# Patient Record
Sex: Male | Born: 1990 | Race: Asian | Hispanic: No | Marital: Single | Smoking: Former smoker
Health system: Southern US, Community
[De-identification: ages and names within clinical notes are randomized; demographics above are authoritative.]

## PROBLEM LIST (undated history)

## (undated) DIAGNOSIS — Z789 Other specified health status: Secondary | ICD-10-CM

## (undated) HISTORY — PX: NO PAST SURGERIES: SHX2092

---
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
## 2008-02-03 ENCOUNTER — Emergency Department (HOSPITAL_COMMUNITY): Admission: EM | Admit: 2008-02-03 | Discharge: 2008-02-03 | Payer: Self-pay | Admitting: Emergency Medicine

## 2008-02-06 ENCOUNTER — Emergency Department (HOSPITAL_COMMUNITY): Admission: EM | Admit: 2008-02-06 | Discharge: 2008-02-06 | Payer: Self-pay | Admitting: Emergency Medicine

## 2009-09-01 ENCOUNTER — Emergency Department (HOSPITAL_COMMUNITY): Admission: EM | Admit: 2009-09-01 | Discharge: 2009-09-01 | Payer: Self-pay | Admitting: Family Medicine
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
## 2010-11-24 LAB — GLUCOSE, CAPILLARY: Glucose-Capillary: 128 mg/dL — ABNORMAL HIGH (ref 70–99)

## 2011-02-23 ENCOUNTER — Emergency Department (HOSPITAL_COMMUNITY)
Admission: EM | Admit: 2011-02-23 | Discharge: 2011-02-23 | Disposition: A | Discharge status: Home / Self Care | Payer: 59 | Source: Home / Self Care | Attending: Family Medicine | Admitting: Family Medicine

## 2011-02-23 DIAGNOSIS — J40 Bronchitis, not specified as acute or chronic: Secondary | ICD-10-CM

## 2011-02-23 MED ORDER — ALBUTEROL SULFATE HFA 108 (90 BASE) MCG/ACT IN AERS
2.0000 | INHALATION_SPRAY | RESPIRATORY_TRACT | Status: DC | PRN
  Administered 2011-02-23: 2 via RESPIRATORY_TRACT

## 2011-02-23 MED ORDER — GUAIFENESIN-CODEINE 100-10 MG/5ML PO SYRP: 5.0000 mL | ORAL_SOLUTION | Freq: Four times a day (QID) | ORAL | Status: AC | PRN

## 2011-02-23 MED ORDER — ALBUTEROL SULFATE HFA 108 (90 BASE) MCG/ACT IN AERS
INHALATION_SPRAY | RESPIRATORY_TRACT | Status: AC
  Filled 2011-02-23: qty 6.7

## 2011-02-23 MED ORDER — AZITHROMYCIN 250 MG PO TABS: 250.0000 mg | ORAL_TABLET | Freq: Every day | ORAL | Status: AC

## 2011-02-23 NOTE — ED Provider Notes (Signed)
History     CSN: [id number]  Arrival date & time 02/23/11  Philip Wong   First MD Initiated Contact with Patient 02/23/11 1951      Chief Complaint  Patient presents with  . Cough    (Consider location/radiation/quality/duration/timing/severity/associated sxs/prior treatment) HPI Comments: Philip Wong presents for evaluation of 2 weeks of non-productive cough. He reports headache with the cough. He denies any sick contacts. He denies any hx of asthma and he does not smoke.  Patient is a 21 y.o. male presenting with cough. The history is provided by the patient.  Cough This is a new problem. The current episode started more than 1 week ago. The problem occurs constantly. The problem has not changed since onset.The cough is non-productive. Associated symptoms include headaches and rhinorrhea. Pertinent negatives include no chest pain, no shortness of breath and no wheezing. He is not a smoker. His past medical history does not include asthma.    History reviewed. No pertinent past medical history.  History reviewed. No pertinent past surgical history.  History reviewed. No pertinent family history.  History  Substance Use Topics  . Smoking status: Never Smoker   . Smokeless tobacco: Not on file  . Alcohol Use: No      Review of Systems  Constitutional: Negative.  Negative for fever.  HENT: Positive for congestion, rhinorrhea and sinus pressure.   Eyes: Negative.   Respiratory: Positive for cough. Negative for shortness of breath and wheezing.   Cardiovascular: Negative.  Negative for chest pain.  Gastrointestinal: Negative.   Genitourinary: Negative.   Musculoskeletal: Negative.   Skin: Negative.   Neurological: Positive for headaches.    Allergies  Review of patient's allergies indicates no known allergies.  Home Medications   Current Outpatient Rx  Name Route Sig Dispense Refill  . ACETAMINOPHEN 325 MG PO TABS Oral Take 650 mg by mouth every 6 (six) hours as needed.          BP 127/63  Pulse 75  Temp(Src) 97.3 F (36.3 C) (Oral)  Resp 16  SpO2 97%  Physical Exam  Nursing note and vitals reviewed. Constitutional: He is oriented to person, place, and time. He appears well-developed and well-nourished.  HENT:  Head: Normocephalic and atraumatic.  Right Ear: Tympanic membrane normal.  Left Ear: Tympanic membrane normal.  Mouth/Throat: Uvula is midline, oropharynx is clear and moist and mucous membranes are normal.  Eyes: EOM are normal.  Neck: Normal range of motion.  Cardiovascular: Normal rate and regular rhythm.   Pulmonary/Chest: Effort normal. He has wheezes.  Musculoskeletal: Normal range of motion.  Neurological: He is alert and oriented to person, place, and time.  Skin: Skin is warm and dry.  Psychiatric: His behavior is normal.    ED Course  Procedures (including critical care time)  Labs Reviewed - No data to display No results found.   1. Bronchitis       MDM  rx given for Z-pak and guaifenesin AC; supportive care        Richardo Priest, MD 03/24/11 [phone]

## 2011-02-23 NOTE — ED Notes (Signed)
Patient c/o cough , stuffiness for 2 weeks ; c/o facial pressure, pain in top of head when he coughs

## 2011-11-11 ENCOUNTER — Encounter (HOSPITAL_COMMUNITY): Payer: Self-pay | Admitting: Emergency Medicine

## 2011-11-11 ENCOUNTER — Emergency Department (HOSPITAL_COMMUNITY)
Admission: EM | Admit: 2011-11-11 | Discharge: 2011-11-11 | Disposition: A | Discharge status: Home / Self Care | Payer: Self-pay | Attending: Emergency Medicine | Admitting: Emergency Medicine

## 2011-11-11 DIAGNOSIS — S0191XA Laceration without foreign body of unspecified part of head, initial encounter: Secondary | ICD-10-CM

## 2011-11-11 DIAGNOSIS — Z23 Encounter for immunization: Secondary | ICD-10-CM | POA: Insufficient documentation

## 2011-11-11 DIAGNOSIS — S0190XA Unspecified open wound of unspecified part of head, initial encounter: Secondary | ICD-10-CM | POA: Insufficient documentation

## 2011-11-11 DIAGNOSIS — W010XXA Fall on same level from slipping, tripping and stumbling without subsequent striking against object, initial encounter: Secondary | ICD-10-CM | POA: Insufficient documentation

## 2011-11-11 MED ORDER — TETANUS-DIPHTH-ACELL PERTUSSIS 5-2.5-18.5 LF-MCG/0.5 IM SUSP
0.5000 mL | Freq: Once | INTRAMUSCULAR | Status: AC
  Administered 2011-11-11: 0.5 mL via INTRAMUSCULAR
  Filled 2011-11-11: qty 0.5

## 2011-11-11 NOTE — ED Notes (Signed)
Pt has about 4cm lac to back of head on L side. Bleeding controlled. Pt holding gauze to back of head. Pt denies n/v. Pt a/o x 4.

## 2011-11-11 NOTE — ED Notes (Signed)
PA Robyn at bedside  

## 2011-11-11 NOTE — ED Notes (Signed)
Wound dressed with Telfa gauze, gauze and Coban.

## 2011-11-11 NOTE — ED Notes (Signed)
Washed wound to back of head with gauze and normal saline. Pt tolerated well.

## 2011-11-11 NOTE — ED Notes (Signed)
[id number]<YN> Expected date:<BR> Expected time:<BR> Means of arrival:<BR> Comments:<BR> Breathing Tx in progress

## 2011-11-11 NOTE — ED Provider Notes (Signed)
History     CSN: [id number]  Arrival date & time 11/11/11  2105   First MD Initiated Contact with Patient 11/11/11 2209      Chief Complaint  Patient presents with  . Head Laceration    (Consider location/radiation/quality/duration/timing/severity/associated sxs/prior treatment) HPI Comments: 21 year old male presents to the emergency department with a laceration to the back of his head. He states earlier tonight he was outside and slipped and fell onto the back of his head. Denies any loss of consciousness. He has been drinking tonight. Denies any lightheadedness, dizziness, nausea, vomiting, visual disturbance or confusion. He denies any pain. He is unsure when his last tetanus shot was.  Patient is a 21 y.o. male presenting with scalp laceration. The history is provided by the patient and a friend.  Head Laceration Pertinent negatives include no headaches, neck pain or weakness.    History reviewed. No pertinent past medical history.  History reviewed. No pertinent past surgical history.  No family history on file.  History  Substance Use Topics  . Smoking status: Never Smoker   . Smokeless tobacco: Not on file  . Alcohol Use: No      Review of Systems  Constitutional: Negative for activity change.  HENT: Negative for neck pain.   Respiratory: Negative for shortness of breath.   Skin: Positive for wound.  Neurological: Negative for dizziness, syncope, weakness, light-headedness and headaches.  Psychiatric/Behavioral: Negative for confusion.    Allergies  Review of patient's allergies indicates no known allergies.  Home Medications  No current outpatient prescriptions on file.  BP 120/78  Pulse 98  Temp 98 F (36.7 C)  Resp 16  Wt 130 lb (58.968 kg)  SpO2 99%  Physical Exam  Constitutional: He is oriented to person, place, and time. He appears well-developed and well-nourished. No distress.  HENT:  Head: Normocephalic.  Mouth/Throat: Uvula is midline,  oropharynx is clear and moist and mucous membranes are normal.       1 in laceration on posterior aspect of scalp. Small hematoma. Non tender  Eyes: Conjunctivae normal and EOM are normal. Pupils are equal, round, and reactive to light.  Neck: Normal range of motion. No spinous process tenderness and no muscular tenderness present.  Cardiovascular: Normal rate, regular rhythm and normal heart sounds.   Pulmonary/Chest: Effort normal and breath sounds normal.  Musculoskeletal: Normal range of motion.  Neurological: He is alert and oriented to person, place, and time. No cranial nerve deficit or sensory deficit. Coordination and gait normal.  Skin: Skin is warm and dry. Laceration (1 inch laceration to posterior aspect of scalp) noted.  Psychiatric: His affect is inappropriate. His speech is slurred. He is is hyperactive. Cognition and memory are normal. He expresses inappropriate judgment.    ED Course  Procedures (including critical care time) LACERATION REPAIR Performed by: Johnnette Gourd Authorized by: Johnnette Gourd Consent: Verbal consent obtained. Risks and benefits: risks, benefits and alternatives were discussed Consent given by: patient Patient identity confirmed: provided demographic data Prepped and Draped in normal sterile fashion Wound explored  Laceration Location: posterior aspect of scalp  Laceration Length: 2 cm  No Foreign Bodies seen or palpated  Anesthesia: local infiltration  Local anesthetic: lidocaine 2% without epinephrine  Anesthetic total: 2 ml  Irrigation method: syringe Amount of cleaning: standard  Skin closure: staples  Number of staples: 3  Technique: staples  Patient tolerance: Patient tolerated the procedure well with no immediate complications.  Labs Reviewed - No data to display No  results found.   1. Laceration of head       MDM  21 year old male with laceration on the posterior aspect of the scalp. 3 staples placed without any  problem. Patient is denying any pain. He denies any loss of consciousness. No red flags concerning patient's head injury. He has no focal neurologic deficits. He is alert and oriented. He smells of alcohol.       Trevor Mace, PA-C 11/11/11 2308
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
## 2011-11-13 NOTE — ED Provider Notes (Signed)
Medical screening examination/treatment/procedure(s) were performed by non-physician practitioner and as supervising physician I was immediately available for consultation/collaboration.  Maleni Seyer L Belita Warsame, MD 11/13/11 2042 

## 2011-11-16 ENCOUNTER — Emergency Department (HOSPITAL_COMMUNITY)
Admission: EM | Admit: 2011-11-16 | Discharge: 2011-11-16 | Disposition: A | Discharge status: Home / Self Care | Payer: 59 | Attending: Emergency Medicine | Admitting: Emergency Medicine

## 2011-11-16 ENCOUNTER — Encounter (HOSPITAL_COMMUNITY): Payer: Self-pay | Admitting: Emergency Medicine

## 2011-11-16 DIAGNOSIS — Z4802 Encounter for removal of sutures: Secondary | ICD-10-CM | POA: Insufficient documentation

## 2011-11-16 NOTE — ED Provider Notes (Signed)
History     CSN: [id number]  Arrival date & time 11/16/11  4098   First MD Initiated Contact with Patient 11/16/11 1003      Chief Complaint  Patient presents with  . Suture / Staple Removal    (Consider location/radiation/quality/duration/timing/severity/associated sxs/prior treatment) Patient is a 21 y.o. male presenting with suture removal.  Suture / Staple Removal  The sutures were placed 3 to 6 days ago. There has been no treatment since the wound repair. There has been no drainage from the wound. There is no redness present. There is no swelling present. The pain has no pain.  Pt fell 5 days ago. Was seen in ED, staples placed in posterior scalp. No problems with laceration reported. No signs of infection.   History reviewed. No pertinent past medical history.  History reviewed. No pertinent past surgical history.  History reviewed. No pertinent family history.  History  Substance Use Topics  . Smoking status: Never Smoker   . Smokeless tobacco: Not on file  . Alcohol Use: No      Review of Systems  Constitutional: Negative for fever and chills.  HENT: Negative for neck pain and neck stiffness.   Respiratory: Negative.   Cardiovascular: Negative.   Skin: Negative for wound.  Neurological: Negative for dizziness, weakness and headaches.    Allergies  Review of patient's allergies indicates no known allergies.  Home Medications  No current outpatient prescriptions on file.  BP 132/70  Pulse 88  Temp 98.1 F (36.7 C) (Oral)  Resp 16  Ht 5\' 1"  (1.549 m)  Wt 142 lb (64.411 kg)  BMI 26.83 kg/m2  SpO2 97%  Physical Exam  Nursing note and vitals reviewed. Constitutional: He appears well-developed and well-nourished. No distress.  Eyes: Conjunctivae normal and EOM are normal. Pupils are equal, round, and reactive to light.  Neck: Neck supple.  Cardiovascular: Normal rate, regular rhythm and normal heart sounds.   Pulmonary/Chest: Effort normal and breath  sounds normal. No respiratory distress. He has no wheezes. He has no rales.  Neurological: He is alert.  Skin: Skin is warm and dry.       2cm laceration to the posterior scalp. Healing. No signs of infection or dehiscence.  Psychiatric: He has a normal mood and affect.    ED Course  Procedures (including critical care time)  Wound healing well. No signs of infection. No dehiscence. Staples removed. Will d/c home with follow up as needed.  1. Encounter for staple removal       MDM          Lottie Mussel, PA 11/16/11 1034

## 2011-11-16 NOTE — ED Notes (Signed)
Pt had staples placed to back of scalp.  3 staples noted to scalp, no swelling, drainage, or redness noted.  Pt denies c/o.

## 2011-11-17 NOTE — ED Provider Notes (Signed)
Medical screening examination/treatment/procedure(s) were performed by non-physician practitioner and as supervising physician I was immediately available for consultation/collaboration.  Tobin Chad, MD 11/17/11 [phone]

## 2016-11-26 ENCOUNTER — Encounter (HOSPITAL_COMMUNITY): Payer: Self-pay | Admitting: Emergency Medicine

## 2016-11-26 ENCOUNTER — Ambulatory Visit (HOSPITAL_COMMUNITY)
Admission: EM | Admit: 2016-11-26 | Discharge: 2016-11-26 | Disposition: A | Discharge status: Home / Self Care | Payer: BLUE CROSS/BLUE SHIELD | Attending: Internal Medicine | Admitting: Internal Medicine

## 2016-11-26 DIAGNOSIS — R69 Illness, unspecified: Principal | ICD-10-CM

## 2016-11-26 DIAGNOSIS — J111 Influenza due to unidentified influenza virus with other respiratory manifestations: Secondary | ICD-10-CM

## 2016-11-26 MED ORDER — ONDANSETRON HCL 4 MG PO TABS: 4.0000 mg | ORAL_TABLET | Freq: Four times a day (QID) | ORAL | 0 refills | Status: DC

## 2016-11-26 MED ORDER — OSELTAMIVIR PHOSPHATE 75 MG PO CAPS: 75.0000 mg | ORAL_CAPSULE | Freq: Two times a day (BID) | ORAL | 0 refills | Status: AC

## 2016-11-26 NOTE — ED Triage Notes (Signed)
Pt here for fever and cough with vomiting x 2 this am

## 2017-02-09 ENCOUNTER — Other Ambulatory Visit: Payer: Self-pay

## 2017-02-09 ENCOUNTER — Ambulatory Visit (HOSPITAL_COMMUNITY)
Admission: EM | Admit: 2017-02-09 | Discharge: 2017-02-09 | Disposition: A | Discharge status: Home / Self Care | Payer: BLUE CROSS/BLUE SHIELD | Attending: Family Medicine | Admitting: Family Medicine

## 2017-02-09 ENCOUNTER — Encounter (HOSPITAL_COMMUNITY): Payer: Self-pay | Admitting: *Deleted

## 2017-02-09 DIAGNOSIS — N5089 Other specified disorders of the male genital organs: Secondary | ICD-10-CM

## 2017-02-09 DIAGNOSIS — N451 Epididymitis: Secondary | ICD-10-CM | POA: Diagnosis not present

## 2017-02-09 DIAGNOSIS — Z79899 Other long term (current) drug therapy: Secondary | ICD-10-CM | POA: Diagnosis not present

## 2017-02-09 DIAGNOSIS — N50812 Left testicular pain: Secondary | ICD-10-CM | POA: Diagnosis present

## 2017-02-09 LAB — POCT URINALYSIS DIP (DEVICE)
Bilirubin Urine: NEGATIVE
GLUCOSE, UA: NEGATIVE mg/dL
KETONES UR: NEGATIVE mg/dL
Nitrite: NEGATIVE
PROTEIN: NEGATIVE mg/dL
Specific Gravity, Urine: >=1.03 (ref 1.005–1.030)
UROBILINOGEN UA: 1 mg/dL (ref 0.0–1.0)
pH: 6 (ref 5.0–8.0)

## 2017-02-09 MED ORDER — DOXYCYCLINE HYCLATE 100 MG PO CAPS: 100.0000 mg | ORAL_CAPSULE | Freq: Two times a day (BID) | ORAL | 0 refills | Status: DC

## 2017-02-09 MED ORDER — LIDOCAINE HCL (PF) 1 % IJ SOLN
INTRAMUSCULAR | Status: AC
  Filled 2017-02-09: qty 2

## 2017-02-09 MED ORDER — CEFTRIAXONE SODIUM 250 MG IJ SOLR
250.0000 mg | Freq: Once | INTRAMUSCULAR | Status: AC
  Administered 2017-02-09: 250 mg via INTRAMUSCULAR

## 2017-02-09 MED ORDER — CEFTRIAXONE SODIUM 250 MG IJ SOLR
INTRAMUSCULAR | Status: AC
  Filled 2017-02-09: qty 250

## 2017-02-09 NOTE — Discharge Instructions (Signed)
Will treat you for infection. The shot here and then a RX for Doxy. You should note improvement soon, if not then f/u. We will have the final results of your urine test soon.

## 2017-02-09 NOTE — ED Notes (Signed)
Patient still unable to void. Prior to being brought back, patient voided while waiting in the lobby

## 2017-02-09 NOTE — ED Provider Notes (Signed)
[facility]    CSN: [id number] Arrival date & time: 02/09/17  1603     History   Chief Complaint Chief Complaint  Patient presents with  . Testicle Pain    HPI Philip Wong is a 26 Philip.o. male.   Who presents with pain and swelling of the left testicle for 5 days. Denies penile discharge, lesions or dysuria. He reports no unprotected sex in 30 days. No prior history. No abdominal pain.       History reviewed. No pertinent past medical history.  There are no active problems to display for this patient.   History reviewed. No pertinent surgical history.     Home Medications    Prior to Admission medications   Medication Sig Start Date End Date Taking? Authorizing Provider  Acetaminophen (TYLENOL PO) Take by mouth.   Yes [provider]  doxycycline (VIBRAMYCIN) 100 MG capsule Take 1 capsule (100 mg total) by mouth 2 (two) times daily. 02/09/17   Riki SheerYoung, Syleena Mchan G, PA-C  ondansetron (ZOFRAN) 4 MG tablet Take 1 tablet (4 mg total) by mouth every 6 (six) hours. 11/26/16   Arnaldo Nataliamond, Michael S, MD    Family History No family history on file.  Social History Social History   Tobacco Use  . Smoking status: Never Smoker  Substance Use Topics  . Alcohol use: No  . Drug use: No     Allergies   Patient has no known allergies.   Review of Systems Review of Systems  All other systems reviewed and are negative.    Physical Exam Triage Vital Signs ED Triage Vitals [02/09/17 1617]  Enc Vitals Group     BP 131/80     Pulse Rate 65     Resp 16     Temp 97.9 F (36.6 C)     Temp src      SpO2 100 %     Weight      Height      Head Circumference      Peak Flow      Pain Score      Pain Loc      Pain Edu?      Excl. in GC?    No data found.  Updated Vital Signs BP 131/80   Pulse 65   Temp 97.9 F (36.6 C)   Resp 16   SpO2 100%   Visual Acuity Right Eye Distance:   Left Eye Distance:   Bilateral Distance:    Right Eye  Near:   Left Eye Near:    Bilateral Near:     Physical Exam  Constitutional: He appears well-developed and well-nourished. No distress.  Genitourinary: Penis normal. No penile tenderness.  Genitourinary Comments: Left testicle with swelling posteriorly and pain with palpation.   Neurological: He is alert.  Skin: Skin is warm and dry. He is not diaphoretic.  Psychiatric: His behavior is normal.  Nursing note and vitals reviewed.    UC Treatments / Results  Labs (all labs ordered are listed, but only abnormal results are displayed) Labs Reviewed  URINE CYTOLOGY ANCILLARY ONLY    EKG  EKG Interpretation None       Radiology No results found.  Procedures Procedures (including critical care time)  Medications Ordered in UC Medications - No data to display   Initial Impression / Assessment and Plan / UC Course  I have reviewed the triage vital signs and the nursing notes.  Pertinent labs & imaging results that  were available during my care of the patient were reviewed by me and considered in my medical decision making (see chart for details).     Acute epididymitis. Given risk factors will check for underlying STD's. Treat with Rocephin 250 mg IM here in conjunction with Doxycycline x 10 days. FU if worsens.   Final Clinical Impressions(s) / UC Diagnoses   Final diagnoses:  None    ED Discharge Orders        Ordered    doxycycline (VIBRAMYCIN) 100 MG capsule  2 times daily     02/09/17 1625       Controlled Substance Prescriptions [place] Controlled Substance Registry consulted? Not Applicable   Sharin MonsYoung, Arisa Congleton G, PA-C 02/09/17 1723

## 2017-02-09 NOTE — ED Triage Notes (Signed)
C/O left testicular pain and swelling x 5 days.  Pain relieved if he is still.  Denies penile discharge or fevers.

## 2017-02-11 LAB — URINE CYTOLOGY ANCILLARY ONLY
CHLAMYDIA, DNA PROBE: POSITIVE — AB
NEISSERIA GONORRHEA: NEGATIVE
TRICH (WINDOWPATH): NEGATIVE

## 2017-05-02 ENCOUNTER — Encounter (HOSPITAL_COMMUNITY): Payer: Self-pay | Admitting: Emergency Medicine

## 2017-05-02 ENCOUNTER — Ambulatory Visit (HOSPITAL_COMMUNITY)
Admission: EM | Admit: 2017-05-02 | Discharge: 2017-05-02 | Disposition: A | Discharge status: Home / Self Care | Payer: BLUE CROSS/BLUE SHIELD | Attending: Internal Medicine | Admitting: Internal Medicine

## 2017-05-02 ENCOUNTER — Ambulatory Visit (INDEPENDENT_AMBULATORY_CARE_PROVIDER_SITE_OTHER): Payer: BLUE CROSS/BLUE SHIELD

## 2017-05-02 DIAGNOSIS — R05 Cough: Secondary | ICD-10-CM

## 2017-05-02 DIAGNOSIS — B349 Viral infection, unspecified: Secondary | ICD-10-CM

## 2017-05-02 MED ORDER — BENZONATATE 100 MG PO CAPS: 100.0000 mg | ORAL_CAPSULE | Freq: Three times a day (TID) | ORAL | 0 refills | Status: DC

## 2017-05-02 MED ORDER — IPRATROPIUM BROMIDE 0.06 % NA SOLN: 2.0000 | Freq: Four times a day (QID) | NASAL | 0 refills | Status: DC

## 2017-05-02 MED ORDER — NAPROXEN 500 MG PO TABS: 500.0000 mg | ORAL_TABLET | Freq: Two times a day (BID) | ORAL | 0 refills | Status: AC

## 2017-05-02 MED ORDER — FLUTICASONE PROPIONATE 50 MCG/ACT NA SUSP: 2.0000 | Freq: Every day | NASAL | 0 refills | Status: DC

## 2017-05-02 NOTE — Discharge Instructions (Signed)
Chest x-ray negative for pneumonia. Tessalon for cough. Naproxen for headache. Do not take ibuprofen while on naproxen. Start flonase, atrovent nasal spray for nasal congestion/drainage seen on exam. You can use over the counter nasal saline rinse such as neti pot for nasal congestion. Keep hydrated, your urine should be clear to pale yellow in color. Tylenol/motrin for fever and pain. Monitor for any worsening of symptoms, chest pain, shortness of breath, wheezing, swelling of the throat, follow up for reevaluation.   For sore throat try using a honey-based tea. Use 3 teaspoons of honey with juice squeezed from half lemon. Place shaved pieces of ginger into 1/2-1 cup of water and warm over stove top. Then mix the ingredients and repeat every 4 hours as needed.

## 2017-05-02 NOTE — ED Triage Notes (Signed)
PT reports cough, headache, and fatigue that started yesterday morning.

## 2017-05-02 NOTE — ED Provider Notes (Signed)
[facility]    CSN: [id number] Arrival date & time: 05/02/17  1553     History   Chief Complaint Chief Complaint  Patient presents with  . Cough  . Headache    HPI Philip Wong is a 27 Philip.o. male.   27 year old male comes in for 2 day history of URI symptoms.  He has had cough, headache and fatigue, sore throat. Denies nasal congestion, rhinorrhea. Subjective fever with chills. Has had nausea without vomiting. Denies abdominal pain, diarrhea. otc nyquil/tylenol with some improvement. Never smoker.       History reviewed. No pertinent past medical history.  There are no active problems to display for this patient.   History reviewed. No pertinent surgical history.     Home Medications    Prior to Admission medications   Medication Sig Start Date End Date Taking? Authorizing Provider  benzonatate (TESSALON) 100 MG capsule Take 1 capsule (100 mg total) by mouth every 8 (eight) hours. 05/02/17   Cathie Hoops, Amy V, PA-C  fluticasone (FLONASE) 50 MCG/ACT nasal spray Place 2 sprays into both nostrils daily. 05/02/17   Cathie Hoops, Amy V, PA-C  ipratropium (ATROVENT) 0.06 % nasal spray Place 2 sprays into both nostrils 4 (four) times daily. 05/02/17   Cathie Hoops, Amy V, PA-C  naproxen (NAPROSYN) 500 MG tablet Take 1 tablet (500 mg total) by mouth 2 (two) times daily for 10 days. 05/02/17 05/12/17  Belinda Fisher, PA-C    Family History No family history on file.  Social History Social History   Tobacco Use  . Smoking status: Never Smoker  Substance Use Topics  . Alcohol use: No  . Drug use: No     Allergies   Patient has no known allergies.   Review of Systems Review of Systems  Reason unable to perform ROS: See HPI as above.     Physical Exam Triage Vital Signs ED Triage Vitals  Enc Vitals Group     BP 05/02/17 1727 115/61     Pulse Rate 05/02/17 1727 92     Resp 05/02/17 1727 16     Temp 05/02/17 1727 98.7 F (37.1 C)     Temp Source 05/02/17 1727 Oral     SpO2  05/02/17 1727 98 %     Weight 05/02/17 1728 142 lb (64.4 kg)     Height --      Head Circumference --      Peak Flow --      Pain Score 05/02/17 1728 5     Pain Loc --      Pain Edu? --      Excl. in GC? --    No data found.  Updated Vital Signs BP 115/61   Pulse 92   Temp 98.7 F (37.1 C) (Oral)   Resp 16   Wt 142 lb (64.4 kg)   SpO2 98%   BMI 26.83 kg/m   Physical Exam  Constitutional: He is oriented to person, place, and time. He appears well-developed and well-nourished. No distress.  HENT:  Head: Normocephalic and atraumatic.  Right Ear: Tympanic membrane, external ear and ear canal normal. Tympanic membrane is not erythematous and not bulging.  Left Ear: Tympanic membrane, external ear and ear canal normal. Tympanic membrane is not erythematous and not bulging.  Nose: Nose normal. Right sinus exhibits no maxillary sinus tenderness and no frontal sinus tenderness. Left sinus exhibits no maxillary sinus tenderness and no frontal sinus tenderness.  Mouth/Throat: Uvula is midline, oropharynx  is clear and moist and mucous membranes are normal.  Eyes: Conjunctivae are normal. Pupils are equal, round, and reactive to light.  Neck: Normal range of motion. Neck supple.  Cardiovascular: Normal rate, regular rhythm and normal heart sounds. Exam reveals no gallop and no friction rub.  No murmur heard. Pulmonary/Chest: Effort normal.  Diffuse coarse breath sounds with increased crackles of the right lower base despite coughing.  Lymphadenopathy:    He has no cervical adenopathy.  Neurological: He is alert and oriented to person, place, and time.  Skin: Skin is warm and dry.  Psychiatric: He has a normal mood and affect. His behavior is normal. Judgment normal.     UC Treatments / Results  Labs (all labs ordered are listed, but only abnormal results are displayed) Labs Reviewed - No data to display  EKG  EKG Interpretation None       Radiology Dg Chest 2  View  Result Date: 05/02/2017 CLINICAL DATA:  Cough and fever EXAM: CHEST - 2 VIEW COMPARISON:  None. FINDINGS: Lungs are clear. Heart size and pulmonary vascularity are normal. No adenopathy. No evident bone lesions. IMPRESSION: No edema or consolidation. Electronically Signed   By: Bretta BangWilliam  Woodruff III M.D.   On: 05/02/2017 18:54    Procedures Procedures (including critical care time)  Medications Ordered in UC Medications - No data to display   Initial Impression / Assessment and Plan / UC Course  I have reviewed the triage vital signs and the nursing notes.  Pertinent labs & imaging results that were available during my care of the patient were reviewed by me and considered in my medical decision making (see chart for details).    Chest x-ray negative for pneumonia.  Symptomatic treatment as discussed.  Push fluids.  Return precautions given.  Final Clinical Impressions(s) / UC Diagnoses   Final diagnoses:  Viral illness    ED Discharge Orders        Ordered    benzonatate (TESSALON) 100 MG capsule  Every 8 hours     05/02/17 1903    fluticasone (FLONASE) 50 MCG/ACT nasal spray  Daily     05/02/17 1903    ipratropium (ATROVENT) 0.06 % nasal spray  4 times daily     05/02/17 1903    naproxen (NAPROSYN) 500 MG tablet  2 times daily     05/02/17 1903        Lurline IdolYu, Amy V, PA-C 05/02/17 1905

## 2018-05-21 ENCOUNTER — Ambulatory Visit (HOSPITAL_COMMUNITY)
Admission: EM | Admit: 2018-05-21 | Discharge: 2018-05-21 | Disposition: A | Discharge status: Home / Self Care | Payer: PRIVATE HEALTH INSURANCE | Attending: Family Medicine | Admitting: Family Medicine

## 2018-05-21 ENCOUNTER — Ambulatory Visit (INDEPENDENT_AMBULATORY_CARE_PROVIDER_SITE_OTHER): Payer: PRIVATE HEALTH INSURANCE

## 2018-05-21 ENCOUNTER — Encounter (HOSPITAL_COMMUNITY): Payer: Self-pay | Admitting: Emergency Medicine

## 2018-05-21 ENCOUNTER — Other Ambulatory Visit: Payer: Self-pay

## 2018-05-21 DIAGNOSIS — R0602 Shortness of breath: Secondary | ICD-10-CM

## 2018-05-21 DIAGNOSIS — R6889 Other general symptoms and signs: Secondary | ICD-10-CM

## 2018-05-21 DIAGNOSIS — R05 Cough: Secondary | ICD-10-CM | POA: Diagnosis not present

## 2018-05-21 MED ORDER — ALBUTEROL SULFATE HFA 108 (90 BASE) MCG/ACT IN AERS
INHALATION_SPRAY | RESPIRATORY_TRACT | Status: AC
  Filled 2018-05-21: qty 6.7

## 2018-05-21 MED ORDER — ONDANSETRON HCL 4 MG PO TABS: 4.0000 mg | ORAL_TABLET | Freq: Four times a day (QID) | ORAL | 0 refills | Status: DC

## 2018-05-21 MED ORDER — ALBUTEROL SULFATE HFA 108 (90 BASE) MCG/ACT IN AERS
2.0000 | INHALATION_SPRAY | Freq: Once | RESPIRATORY_TRACT | Status: AC
  Administered 2018-05-21: 2 via RESPIRATORY_TRACT

## 2018-05-21 MED ORDER — BENZONATATE 100 MG PO CAPS: 100.0000 mg | ORAL_CAPSULE | Freq: Three times a day (TID) | ORAL | 0 refills | Status: DC

## 2018-05-21 NOTE — ED Provider Notes (Signed)
[facility]   [id number] 05/21/18 Arrival Time: 1614   CC: URI symptoms   SUBJECTIVE: History from: patient.  Y Loraine Jacquez is a 28 y.o. male who presents with subjective fever, fatigue, cough, persistent diarrhea x3/daily, nausea, and improving decreased sense of taste x 8 days.  Denies sick exposure to COVID, flu or strep.  Denies recent travel. Has tried OTC medications with minimal relief.  Reports previous symptoms in the past and diagnosed with the flu.   Denies sinus pain, rhinorrhea, sore throat, SOB, wheezing, chest pain, changes in bowel or bladder habits.    Received flu shot this year: no.  ROS: As per HPI.  History reviewed. No pertinent past medical history. History reviewed. No pertinent surgical history. No Known Allergies No current facility-administered medications on file prior to encounter.    No current outpatient medications on file prior to encounter.   Social History   Socioeconomic History  . Marital status: Single    Spouse name: Not on file  . Number of children: Not on file  . Years of education: Not on file  . Highest education level: Not on file  Occupational History  . Not on file  Social Needs  . Financial resource strain: Not on file  . Food insecurity:    Worry: Not on file    Inability: Not on file  . Transportation needs:    Medical: Not on file    Non-medical: Not on file  Tobacco Use  . Smoking status: Current Some Day Smoker  Substance and Sexual Activity  . Alcohol use: Yes  . Drug use: No  . Sexual activity: Not Currently  Lifestyle  . Physical activity:    Days per week: Not on file    Minutes per session: Not on file  . Stress: Not on file  Relationships  . Social connections:    Talks on phone: Not on file    Gets together: Not on file    Attends religious service: Not on file    Active member of club or organization: Not on file    Attends meetings of clubs or organizations: Not on file    Relationship  status: Not on file  . Intimate partner violence:    Fear of current or ex partner: Not on file    Emotionally abused: Not on file    Physically abused: Not on file    Forced sexual activity: Not on file  Other Topics Concern  . Not on file  Social History Narrative  . Not on file   Family History  Problem Relation Age of Onset  . Healthy Mother   . Healthy Father     OBJECTIVE:  Vitals:   05/21/18 1645  BP: 114/62  Pulse: 73  Resp: 16  Temp: 98.2 F (36.8 C)  TempSrc: Oral  SpO2: 96%     General appearance: alert; appears mildly fatigued, but nontoxic; speaking in full sentences and tolerating own secretions HEENT: NCAT; Ears: EACs clear, TMs pearly gray; Eyes: PERRL.  EOM grossly intact. Nose: nares patent without rhinorrhea, Throat: oropharynx clear, tonsils non erythematous or enlarged, uvula midline  Neck: supple without LAD Lungs: unlabored respirations, symmetrical air entry; cough: absent; no respiratory distress; wheezes and rhonchi appreciated over posterior chest Heart: regular rate and rhythm.  Radial pulses 2+ symmetrical bilaterally Abdomen: soft, nondistended, normal active bowel sounds; nontender to palpation; no guarding  Skin: warm and dry Psychological: alert and cooperative; normal mood and affect  DIAGNOSTIC  STUDIES:  Dg Chest 2 View  Result Date: 05/21/2018 CLINICAL DATA:  Cough and shortness of breath for the past week. EXAM: CHEST - 2 VIEW COMPARISON:  Chest x-ray dated May 02, 2017. FINDINGS: The heart size and mediastinal contours are within normal limits. Both lungs are clear. The visualized skeletal structures are unremarkable. IMPRESSION: No active cardiopulmonary disease. Electronically Signed   By: Obie Dredge M.D.   On: 05/21/2018 18:05     ASSESSMENT & PLAN:  1. Flu-like symptoms     Meds ordered this encounter  Medications  . albuterol (PROVENTIL HFA;VENTOLIN HFA) 108 (90 Base) MCG/ACT inhaler 2 puff  . benzonatate  (TESSALON) 100 MG capsule    Sig: Take 1 capsule (100 mg total) by mouth every 8 (eight) hours.    Dispense:  21 capsule    Refill:  0    Order Specific Question:   Supervising Provider    Answer:   Eustace Moore [[id number]]  . ondansetron (ZOFRAN) 4 MG tablet    Sig: Take 1 tablet (4 mg total) by mouth every 6 (six) hours.    Dispense:  12 tablet    Refill:  0    Order Specific Question:   Supervising Provider    Answer:   Eustace Moore [[id number]]   Chest x-ray did not show cardiopulmonary disease Symptoms most likely viral Get plenty of rest and push fluids Tessalon Perles prescribed for cough Albuterol inhaler as needed for shortness of breath and/or wheezing Use OTC medications like ibuprofen or tylenol as needed fever or pain Follow up with PCP next week for recheck and to ensure you are improving CALL or go to ER if you have any new or worsening symptoms recorded fever, chills, nausea, vomiting, chest pain, worsening cough, shortness of breath, wheezing, abdominal pain, persistent diarrhea, changes in bowel or bladder habits, etc...  Reviewed expectations re: course of current medical issues. Questions answered. Outlined signs and symptoms indicating need for more acute intervention. Patient verbalized understanding. After Visit Summary given.         Rennis Harding, PA-C 05/21/18 1850

## 2018-05-21 NOTE — ED Notes (Signed)
Patient verbalizes understanding of discharge instructions. Opportunity for questioning and answers were provided. Patient discharged from [facility] by CMA.  

## 2018-05-21 NOTE — Discharge Instructions (Addendum)
Chest x-ray did not show cardiopulmonary disease Symptoms most likely viral Get plenty of rest and push fluids Tessalon Perles prescribed for cough Albuterol inhaler as needed for shortness of breath and/or wheezing Use OTC medications like ibuprofen or tylenol as needed fever or pain Follow up with PCP or with Joaquin Courts FNP next week for recheck and to ensure you are improving CALL or go to ER if you have any new or worsening symptoms recorded fever, chills, nausea, vomiting, chest pain, worsening cough, shortness of breath, wheezing, abdominal pain, persistent diarrhea, changes in bowel or bladder habits, etc..Marland Kitchen

## 2018-05-21 NOTE — ED Triage Notes (Signed)
Complains of cough since 2013, intermittent coughing.    Patient reports fever last Tuesday 05/13/2018, weakness.  Did not check fever, but felt feverish.  No vomiting, did have diarrhea.  Lost sense of taste, that is starting to come back.  No sob

## 2019-04-04 IMAGING — DX CHEST - 2 VIEW
2 series · 2 of 2 positions shown · non-contrast
Comparison: Chest x-ray dated May 02, 2017.

CLINICAL DATA: Cough and shortness of breath for the past week.

EXAM:
CHEST - 2 VIEW

[chest pa]
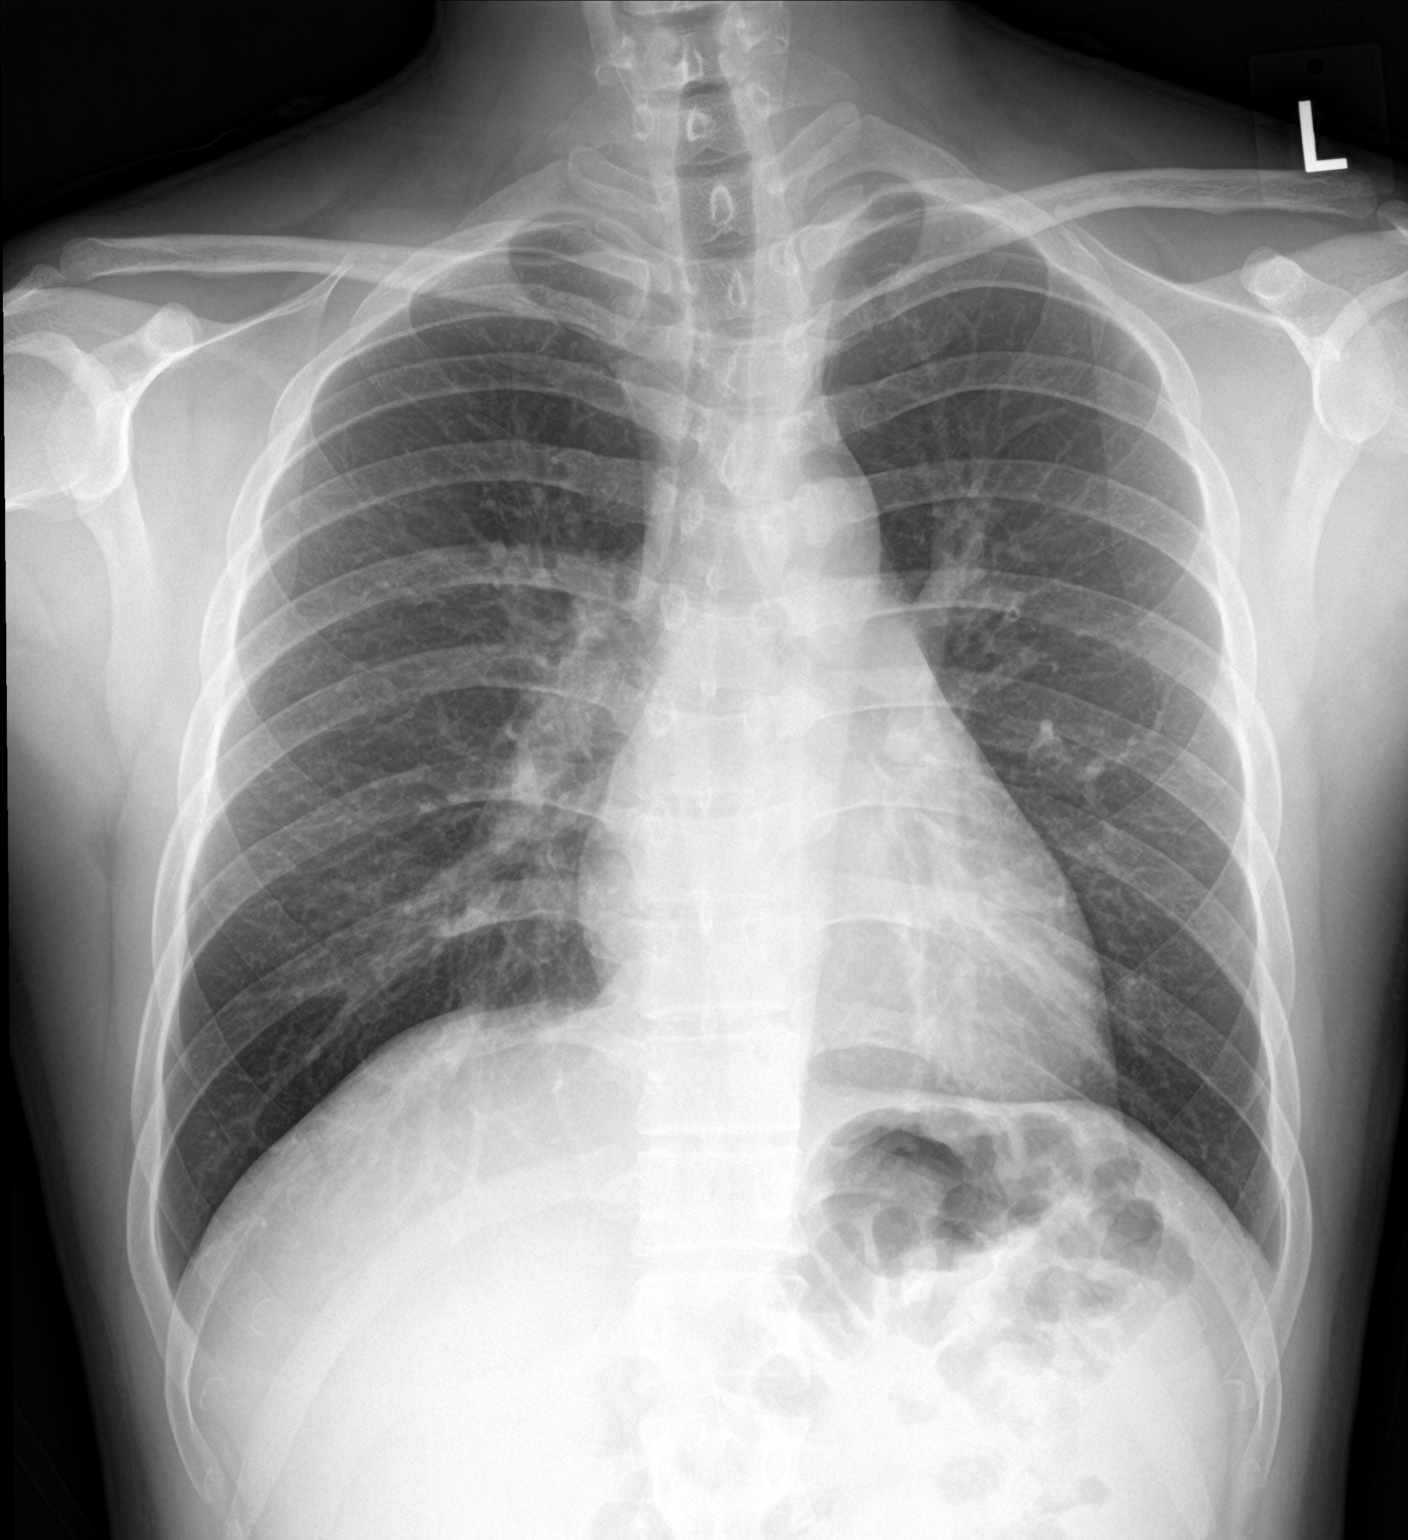

[chest lat]
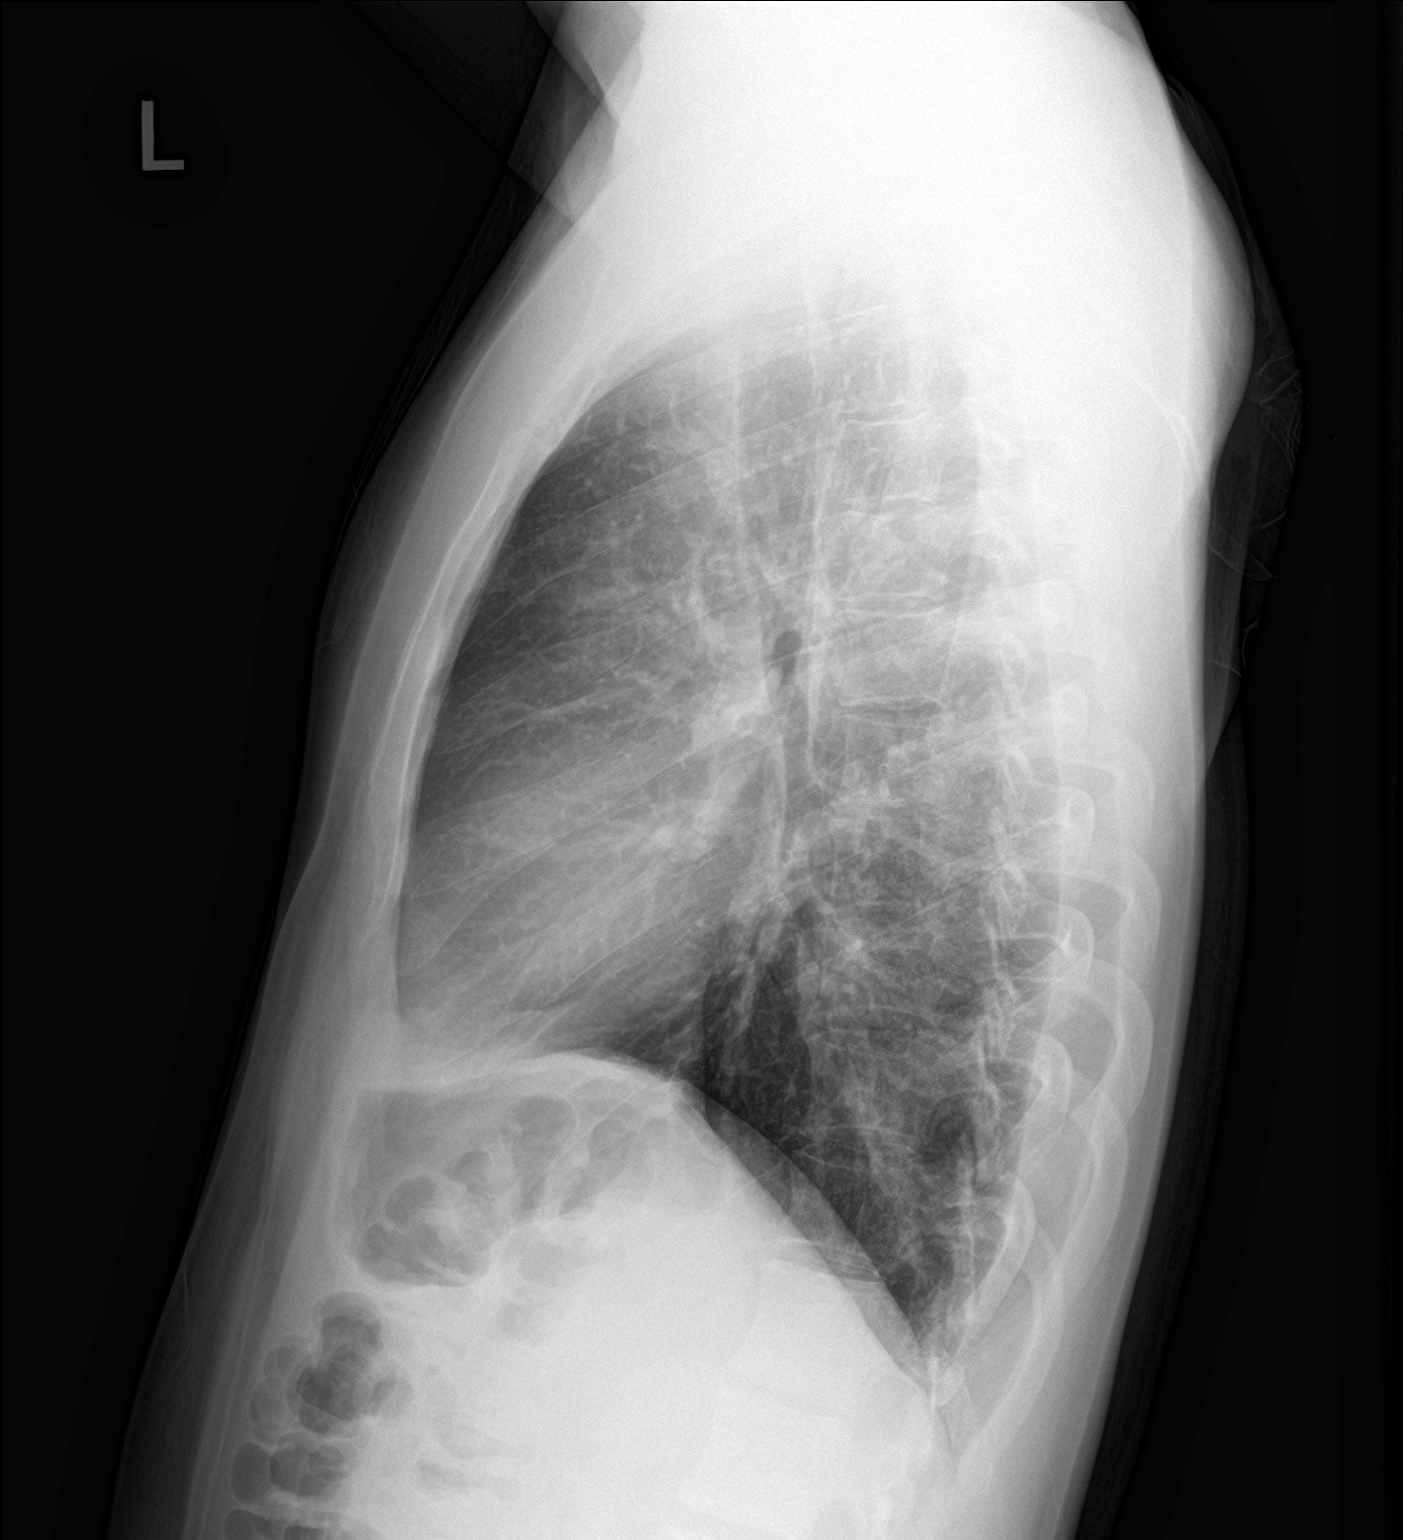

[2 of 2 positions shown; findings below may reference images not displayed]

FINDINGS: The heart size and mediastinal contours are within normal limits.
Both lungs are clear. The visualized skeletal structures are
unremarkable.
IMPRESSION: No active cardiopulmonary disease.

## 2019-04-24 ENCOUNTER — Encounter (HOSPITAL_COMMUNITY): Payer: Self-pay

## 2019-04-24 ENCOUNTER — Other Ambulatory Visit: Payer: Self-pay

## 2019-04-24 ENCOUNTER — Ambulatory Visit (HOSPITAL_COMMUNITY)
Admission: EM | Admit: 2019-04-24 | Discharge: 2019-04-24 | Disposition: A | Discharge status: Home / Self Care | Payer: PRIVATE HEALTH INSURANCE | Attending: Family Medicine | Admitting: Family Medicine

## 2019-04-24 DIAGNOSIS — J069 Acute upper respiratory infection, unspecified: Secondary | ICD-10-CM | POA: Insufficient documentation

## 2019-04-24 DIAGNOSIS — Z20822 Contact with and (suspected) exposure to covid-19: Secondary | ICD-10-CM | POA: Insufficient documentation

## 2019-04-24 DIAGNOSIS — F17200 Nicotine dependence, unspecified, uncomplicated: Secondary | ICD-10-CM | POA: Insufficient documentation

## 2019-04-24 MED ORDER — BENZONATATE 100 MG PO CAPS: 100.0000 mg | ORAL_CAPSULE | Freq: Three times a day (TID) | ORAL | 0 refills | Status: DC

## 2019-04-24 MED ORDER — CETIRIZINE HCL 10 MG PO TABS: 10.0000 mg | ORAL_TABLET | Freq: Every day | ORAL | 0 refills | Status: DC

## 2019-04-24 NOTE — ED Provider Notes (Signed)
[facility]    CSN: [id number] Arrival date & time: 04/24/19  1052      History   Chief Complaint Chief Complaint  Patient presents with  . Fatigue  . Headache  . Cough    HPI Philip Wong is a 29 Philip.o. male.   Pt is a 29 year old male that presents with cough, nasal congestion, rhinorrhea.  Symptoms have been constant over the past few days.  He has not taken any medication for his symptoms.  He has also have mild associated headache.  No fevers, chills, body aches, chest pain, shortness of breath, diarrhea, loss of taste or smell. ROS per HPI    Headache Associated symptoms: cough   Cough Associated symptoms: headaches     History reviewed. No pertinent past medical history.  There are no problems to display for this patient.   History reviewed. No pertinent surgical history.     Home Medications    Prior to Admission medications   Medication Sig Start Date End Date Taking? Authorizing Provider  benzonatate (TESSALON) 100 MG capsule Take 1 capsule (100 mg total) by mouth every 8 (eight) hours. 04/24/19   Dahlia Byes A, NP  cetirizine (ZYRTEC) 10 MG tablet Take 1 tablet (10 mg total) by mouth daily. 04/24/19   Ilea Hilton, Gloris Manchester A, NP  ondansetron (ZOFRAN) 4 MG tablet Take 1 tablet (4 mg total) by mouth every 6 (six) hours. 05/21/18   Rennis Harding, PA-C    Family History Family History  Problem Relation Age of Onset  . Healthy Mother   . Healthy Father     Social History Social History   Tobacco Use  . Smoking status: Current Some Day Smoker  Substance Use Topics  . Alcohol use: Yes  . Drug use: No     Allergies   Patient has no known allergies.   Review of Systems Review of Systems  Respiratory: Positive for cough.   Neurological: Positive for headaches.     Physical Exam Triage Vital Signs ED Triage Vitals  Enc Vitals Group     BP 04/24/19 1119 134/80     Pulse Rate 04/24/19 1119 71     Resp 04/24/19 1119 19     Temp 04/24/19  1119 98.3 F (36.8 C)     Temp Source 04/24/19 1119 Oral     SpO2 04/24/19 1119 99 %     Weight --      Height --      Head Circumference --      Peak Flow --      Pain Score 04/24/19 1121 6     Pain Loc --      Pain Edu? --      Excl. in GC? --    No data found.  Updated Vital Signs BP 134/80 (BP Location: Right Arm)   Pulse 71   Temp 98.3 F (36.8 C) (Oral)   Resp 19   SpO2 99%   Visual Acuity Right Eye Distance:   Left Eye Distance:   Bilateral Distance:    Right Eye Near:   Left Eye Near:    Bilateral Near:     Physical Exam Constitutional:      General: He is not in acute distress.    Appearance: Normal appearance. He is not ill-appearing, toxic-appearing or diaphoretic.  HENT:     Right Ear: Tympanic membrane and ear canal normal.     Left Ear: Tympanic membrane and ear canal normal.  Nose: Congestion present.     Mouth/Throat:     Pharynx: Oropharynx is clear.  Eyes:     Conjunctiva/sclera: Conjunctivae normal.  Cardiovascular:     Rate and Rhythm: Normal rate and regular rhythm.  Pulmonary:     Effort: Pulmonary effort is normal.     Breath sounds: Normal breath sounds.  Skin:    General: Skin is warm and dry.  Neurological:     Mental Status: He is alert.  Psychiatric:        Mood and Affect: Mood normal.      UC Treatments / Results  Labs (all labs ordered are listed, but only abnormal results are displayed) Labs Reviewed  NOVEL CORONAVIRUS, NAA (HOSP ORDER, SEND-OUT TO REF LAB; TAT 18-24 HRS)    EKG   Radiology No results found.  Procedures Procedures (including critical care time)  Medications Ordered in UC Medications - No data to display  Initial Impression / Assessment and Plan / UC Course  I have reviewed the triage vital signs and the nursing notes.  Pertinent labs & imaging results that were available during my care of the patient were reviewed by me and considered in my medical decision making (see chart for  details).     Viral URI versus allergies.  Treating with Zyrtec and Tessalon Perles as needed for cough Covid swab sent for testing with labs pending Work note given Final Clinical Impressions(s) / UC Diagnoses   Final diagnoses:  Viral upper respiratory tract infection     Discharge Instructions     Take the medication as prescribed Follow up as needed for continued or worsening symptoms     ED Prescriptions    Medication Sig Dispense Auth. Provider   benzonatate (TESSALON) 100 MG capsule Take 1 capsule (100 mg total) by mouth every 8 (eight) hours. 21 capsule Lason Eveland A, NP   cetirizine (ZYRTEC) 10 MG tablet Take 1 tablet (10 mg total) by mouth daily. 30 tablet Loura Halt A, NP     PDMP not reviewed this encounter.   Loura Halt A, NP 04/24/19 1344

## 2019-04-24 NOTE — Discharge Instructions (Signed)
Take the medication as prescribed °Follow up as needed for continued or worsening symptoms ° °

## 2019-04-24 NOTE — ED Triage Notes (Signed)
Pt presents with productive cough, ongoing headache, and fatigue since Monday.

## 2019-04-25 LAB — NOVEL CORONAVIRUS, NAA (HOSP ORDER, SEND-OUT TO REF LAB; TAT 18-24 HRS): SARS-CoV-2, NAA: NOT DETECTED

## 2021-01-23 ENCOUNTER — Ambulatory Visit (HOSPITAL_COMMUNITY)
Admission: EM | Admit: 2021-01-23 | Discharge: 2021-01-23 | Disposition: A | Discharge status: Home / Self Care | Payer: BC Managed Care – PPO | Attending: Internal Medicine | Admitting: Internal Medicine

## 2021-01-23 ENCOUNTER — Other Ambulatory Visit: Payer: Self-pay

## 2021-01-23 ENCOUNTER — Encounter (HOSPITAL_COMMUNITY): Payer: Self-pay | Admitting: Emergency Medicine

## 2021-01-23 DIAGNOSIS — Z20822 Contact with and (suspected) exposure to covid-19: Secondary | ICD-10-CM | POA: Diagnosis not present

## 2021-01-23 DIAGNOSIS — K921 Melena: Secondary | ICD-10-CM | POA: Diagnosis present

## 2021-01-23 DIAGNOSIS — R0981 Nasal congestion: Secondary | ICD-10-CM | POA: Diagnosis not present

## 2021-01-23 DIAGNOSIS — J029 Acute pharyngitis, unspecified: Secondary | ICD-10-CM

## 2021-01-23 DIAGNOSIS — R059 Cough, unspecified: Secondary | ICD-10-CM

## 2021-01-23 DIAGNOSIS — R051 Acute cough: Secondary | ICD-10-CM | POA: Diagnosis not present

## 2021-01-23 DIAGNOSIS — K649 Unspecified hemorrhoids: Secondary | ICD-10-CM | POA: Insufficient documentation

## 2021-01-23 DIAGNOSIS — R6889 Other general symptoms and signs: Secondary | ICD-10-CM | POA: Insufficient documentation

## 2021-01-23 MED ORDER — HYDROCORTISONE ACETATE 25 MG RE SUPP: 25.0000 mg | Freq: Two times a day (BID) | RECTAL | 0 refills | Status: DC

## 2021-01-23 MED ORDER — BENZONATATE 100 MG PO CAPS: 100.0000 mg | ORAL_CAPSULE | Freq: Three times a day (TID) | ORAL | 0 refills | Status: DC | PRN

## 2021-01-23 MED ORDER — IBUPROFEN 600 MG PO TABS: 600.0000 mg | ORAL_TABLET | Freq: Four times a day (QID) | ORAL | 0 refills | Status: DC | PRN

## 2021-01-23 NOTE — ED Triage Notes (Signed)
Pt also adds that noticed blood in toilet after having BMs the past couple days.

## 2021-01-23 NOTE — ED Triage Notes (Signed)
Since Thursday night having cough, congestion, sore throat, and chills.

## 2021-01-23 NOTE — Discharge Instructions (Addendum)
Increase oral fluid intake Take medications as prescribed We will call you with recommendations if labs are abnormal Return to urgent care if symptoms worsen. 

## 2021-01-24 LAB — SARS CORONAVIRUS 2 (TAT 6-24 HRS): SARS Coronavirus 2: NEGATIVE

## 2021-01-24 NOTE — ED Provider Notes (Signed)
[facility]    CSN: [id number] Arrival date & time: 01/23/21  1232      History   Chief Complaint Chief Complaint  Patient presents with   Cough   Chills    HPI Philip Wong is a 30 y.o. male urgent care with 2-day history of bloody bowel movement.  Patient says blood is bright red and not mixed with stool.  Blood is found in the toilet bowl as well.  No maroon-colored stools.  Patient denies any weight loss.  He denies a history of hemorrhoids.  Patient denies protrusion of masslike anal lesions after bowel movement.  Patient complains of 4-day history of nonproductive cough, nasal congestion, sore throat and chills.  Patient denies generalized body aches.  No nausea, vomiting or diarrhea.  No constipation.  No sick contacts.  No shortness of breath, wheezing or chest tightness  HPI  History reviewed. No pertinent past medical history.  There are no problems to display for this patient.   History reviewed. No pertinent surgical history.     Home Medications    Prior to Admission medications   Medication Sig Start Date End Date Taking? Authorizing Provider  hydrocortisone (ANUSOL-HC) 25 MG suppository Place 1 suppository (25 mg total) rectally 2 (two) times daily. 01/23/21  Yes Nathanie Ottley, Britta Mccreedy, MD  ibuprofen (ADVIL) 600 MG tablet Take 1 tablet (600 mg total) by mouth every 6 (six) hours as needed. 01/23/21  Yes Rae Sutcliffe, Britta Mccreedy, MD  benzonatate (TESSALON) 100 MG capsule Take 1 capsule (100 mg total) by mouth 3 (three) times daily as needed for cough. 01/23/21   Dawood Spitler, Britta Mccreedy, MD    Family History Family History  Problem Relation Age of Onset   Healthy Mother    Healthy Father     Social History Social History   Tobacco Use   Smoking status: Some Days  Substance Use Topics   Alcohol use: Yes   Drug use: No     Allergies   Patient has no known allergies.   Review of Systems Review of Systems  Constitutional: Negative.  Negative for chills  and fever.  HENT:  Positive for congestion, rhinorrhea and sore throat.   Respiratory:  Positive for cough. Negative for shortness of breath and wheezing.   Gastrointestinal:  Negative for abdominal pain, diarrhea, nausea and vomiting.  Genitourinary: Negative.   Musculoskeletal: Negative.     Physical Exam Triage Vital Signs ED Triage Vitals  Enc Vitals Group     BP 01/23/21 1359 120/83     Pulse Rate 01/23/21 1359 85     Resp 01/23/21 1359 16     Temp 01/23/21 1359 98.4 F (36.9 C)     Temp Source 01/23/21 1359 Oral     SpO2 01/23/21 1359 98 %     Weight --      Height --      Head Circumference --      Peak Flow --      Pain Score 01/23/21 1358 10     Pain Loc --      Pain Edu? --      Excl. in GC? --    No data found.  Updated Vital Signs BP 120/83 (BP Location: Right Arm)   Pulse 85   Temp 98.4 F (36.9 C) (Oral)   Resp 16   SpO2 98%   Visual Acuity Right Eye Distance:   Left Eye Distance:   Bilateral Distance:    Right Eye Near:  Left Eye Near:    Bilateral Near:     Physical Exam Vitals and nursing note reviewed.  Constitutional:      General: He is not in acute distress.    Appearance: He is not ill-appearing.  Cardiovascular:     Rate and Rhythm: Normal rate and regular rhythm.     Pulses: Normal pulses.     Heart sounds: Normal heart sounds.  Pulmonary:     Effort: Pulmonary effort is normal.     Breath sounds: Normal breath sounds.  Abdominal:     General: Bowel sounds are normal.     Palpations: Abdomen is soft.     UC Treatments / Results  Labs (all labs ordered are listed, but only abnormal results are displayed) Labs Reviewed  SARS CORONAVIRUS 2 (TAT 6-24 HRS)    EKG   Radiology No results found.  Procedures Procedures (including critical care time)  Medications Ordered in UC Medications - No data to display  Initial Impression / Assessment and Plan / UC Course  I have reviewed the triage vital signs and the nursing  notes.  Pertinent labs & imaging results that were available during my care of the patient were reviewed by me and considered in my medical decision making (see chart for details).     1.  Flulike symptoms: COVID-19 PCR test has been sent Tylenol/Motrin as needed for fever and/or pain Tessalon Perles as needed for cough If symptoms worsen please return to urgent care to be reevaluated We will call you with recommendations if labs are abnormal.  2.  Hemorrhoidal bleeding: Anusol suppositories Avoid constipation Return to urgent care if symptoms worsen  Final Clinical Impressions(s) / UC Diagnoses   Final diagnoses:  Flu-like symptoms  Bleeding hemorrhoids     Discharge Instructions      Increase oral fluid intake Take medications as prescribed We will call you with recommendations if labs are abnormal Return to urgent care if symptoms worsen.   ED Prescriptions     Medication Sig Dispense Auth. Provider   benzonatate (TESSALON) 100 MG capsule Take 1 capsule (100 mg total) by mouth 3 (three) times daily as needed for cough. 21 capsule Starlin Steib, Britta Mccreedy, MD   hydrocortisone (ANUSOL-HC) 25 MG suppository Place 1 suppository (25 mg total) rectally 2 (two) times daily. 12 suppository Issaac Shipper, Britta Mccreedy, MD   ibuprofen (ADVIL) 600 MG tablet Take 1 tablet (600 mg total) by mouth every 6 (six) hours as needed. 30 tablet Michae Grimley, Britta Mccreedy, MD      PDMP not reviewed this encounter.   Merrilee Jansky, MD 01/26/21 1045

## 2021-10-10 ENCOUNTER — Ambulatory Visit (INDEPENDENT_AMBULATORY_CARE_PROVIDER_SITE_OTHER): Payer: BC Managed Care – PPO

## 2021-10-10 ENCOUNTER — Encounter (HOSPITAL_COMMUNITY): Payer: Self-pay | Admitting: Emergency Medicine

## 2021-10-10 ENCOUNTER — Other Ambulatory Visit: Payer: Self-pay

## 2021-10-10 ENCOUNTER — Ambulatory Visit (HOSPITAL_COMMUNITY)
Admission: EM | Admit: 2021-10-10 | Discharge: 2021-10-10 | Disposition: A | Discharge status: Home / Self Care | Payer: BC Managed Care – PPO | Attending: Family Medicine | Admitting: Family Medicine

## 2021-10-10 DIAGNOSIS — U071 COVID-19: Secondary | ICD-10-CM | POA: Diagnosis not present

## 2021-10-10 DIAGNOSIS — R051 Acute cough: Secondary | ICD-10-CM | POA: Diagnosis present

## 2021-10-10 DIAGNOSIS — R439 Unspecified disturbances of smell and taste: Secondary | ICD-10-CM | POA: Diagnosis not present

## 2021-10-10 DIAGNOSIS — R059 Cough, unspecified: Secondary | ICD-10-CM | POA: Diagnosis not present

## 2021-10-10 DIAGNOSIS — R509 Fever, unspecified: Secondary | ICD-10-CM | POA: Diagnosis present

## 2021-10-10 DIAGNOSIS — R519 Headache, unspecified: Secondary | ICD-10-CM | POA: Diagnosis not present

## 2021-10-10 LAB — SARS CORONAVIRUS 2 BY RT PCR: SARS Coronavirus 2 by RT PCR: POSITIVE — AB

## 2021-10-10 MED ORDER — ACETAMINOPHEN 325 MG PO TABS
650.0000 mg | ORAL_TABLET | Freq: Once | ORAL | Status: AC
  Administered 2021-10-10: 650 mg via ORAL

## 2021-10-10 MED ORDER — BENZONATATE 200 MG PO CAPS: 200.0000 mg | ORAL_CAPSULE | Freq: Three times a day (TID) | ORAL | 0 refills | Status: DC | PRN

## 2021-10-10 MED ORDER — ACETAMINOPHEN 325 MG PO TABS
ORAL_TABLET | ORAL | Status: AC
  Filled 2021-10-10: qty 2

## 2021-10-10 NOTE — Discharge Instructions (Signed)
You were seen today for cough and fever.  You xray was normal.  This is likely viral.  We have swabbed for covid-19 today.  This will be resulted tomorrow and we will notify you if positive.  You are outside the window for treatment, but at least you will know.  Please get plenty of rest, stay hydrated, and take tylenol or motrin for fever and/or pain.  Please self-isolate until results are completed.  Please return if you are not improving as expected.

## 2021-10-10 NOTE — ED Triage Notes (Signed)
8/19 patient started with a fever, patient has a cough.  Patient lost sense of taste, and headaches with coughing.  Marland Kitchen

## 2021-10-10 NOTE — ED Provider Notes (Signed)
[facility]    CSN: [id number] Arrival date & time: 10/10/21  0801      History   Chief Complaint Chief Complaint  Patient presents with   Fever    HPI Philip Wong is a 31 y.o. male.   Patient is here for URI symptoms x 4 days.  Fever, highest today.  Cough, headache, lost of taste, loss of smell.  No runny nose, + congestion.  No n/v.  No diarrhea or abd pain.  No known sick contacts.  He has been taking liquid tylenol, last dose was yesterday morning.   History reviewed. No pertinent past medical history.  There are no problems to display for this patient.   History reviewed. No pertinent surgical history.     Home Medications    Prior to Admission medications   Medication Sig Start Date End Date Taking? Authorizing Provider  acetaminophen (TYLENOL) 160 MG/5ML elixir Take 15 mg/kg by mouth every 4 (four) hours as needed for fever.   Yes [provider]  hydrocortisone (ANUSOL-HC) 25 MG suppository Place 1 suppository (25 mg total) rectally 2 (two) times daily. 01/23/21   Merrilee Jansky, MD  ibuprofen (ADVIL) 600 MG tablet Take 1 tablet (600 mg total) by mouth every 6 (six) hours as needed. 01/23/21   Lamptey, Britta Mccreedy, MD    Family History Family History  Problem Relation Age of Onset   Healthy Mother    Healthy Father     Social History Social History   Tobacco Use   Smoking status: Some Days  Vaping Use   Vaping Use: Never used  Substance Use Topics   Alcohol use: Yes   Drug use: No     Allergies   Patient has no known allergies.   Review of Systems Review of Systems  Constitutional:  Positive for fever.  HENT:  Negative for congestion, sinus pressure, sinus pain and sore throat.   Respiratory:  Positive for cough. Negative for shortness of breath and wheezing.   Cardiovascular: Negative.   Gastrointestinal: Negative.   Genitourinary: Negative.   Musculoskeletal: Negative.      Physical Exam Triage Vital Signs ED  Triage Vitals  Enc Vitals Group     BP 10/10/21 0825 109/77     Pulse Rate 10/10/21 0825 (!) 103     Resp 10/10/21 0825 (!) 22     Temp 10/10/21 0825 (!) 102.7 F (39.3 C)     Temp Source 10/10/21 0825 Oral     SpO2 10/10/21 0825 97 %     Weight --      Height --      Head Circumference --      Peak Flow --      Pain Score 10/10/21 0822 1     Pain Loc --      Pain Edu? --      Excl. in GC? --    No data found.  Updated Vital Signs BP 109/77 (BP Location: Right Arm)   Pulse (!) 103   Temp (!) 102.7 F (39.3 C) (Oral)   Resp (!) 22   SpO2 97%   Visual Acuity Right Eye Distance:   Left Eye Distance:   Bilateral Distance:    Right Eye Near:   Left Eye Near:    Bilateral Near:     Physical Exam Constitutional:      Appearance: Normal appearance.  HENT:     Head: Normocephalic.     Right Ear: Tympanic membrane  normal.     Left Ear: Tympanic membrane normal.     Nose: Nose normal.  Cardiovascular:     Rate and Rhythm: Normal rate and regular rhythm.  Pulmonary:     Effort: Pulmonary effort is normal.     Comments: Fine crackles at the left lower base Musculoskeletal:     Cervical back: Normal range of motion and neck supple. No tenderness.  Skin:    General: Skin is warm.  Neurological:     General: No focal deficit present.     Mental Status: He is alert.      UC Treatments / Results  Labs (all labs ordered are listed, but only abnormal results are displayed) Labs Reviewed - No data to display  EKG   Radiology DG Chest 2 View  Result Date: 10/10/2021 CLINICAL DATA:  cough, fever EXAM: CHEST - 2 VIEW COMPARISON:  Chest x-ray May 21, 2018. FINDINGS: The heart size and mediastinal contours are within normal limits. Both lungs are clear. No visible pleural effusions or pneumothorax. No acute osseous abnormality. IMPRESSION: No active cardiopulmonary disease. Electronically Signed   By: Feliberto Harts M.D.   On: 10/10/2021 08:53     Procedures Procedures (including critical care time)  Medications Ordered in UC Medications  acetaminophen (TYLENOL) tablet 650 mg (has no administration in time range)    Initial Impression / Assessment and Plan / UC Course  I have reviewed the triage vital signs and the nursing notes.  Pertinent labs & imaging results that were available during my care of the patient were reviewed by me and considered in my medical decision making (see chart for details).    Final Clinical Impressions(s) / UC Diagnoses   Final diagnoses:  Fever, unspecified fever cause  Acute cough     Discharge Instructions      You were seen today for cough and fever.  You xray was normal.  This is likely viral.  We have swabbed for covid-19 today.  This will be resulted tomorrow and we will notify you if positive.  You are outside the window for treatment, but at least you will know.  Please get plenty of rest, stay hydrated, and take tylenol or motrin for fever and/or pain.  Please self-isolate until results are completed.  Please return if you are not improving as expected.     ED Prescriptions     Medication Sig Dispense Auth. Provider   benzonatate (TESSALON) 200 MG capsule Take 1 capsule (200 mg total) by mouth 3 (three) times daily as needed for cough. 20 capsule Jannifer Franklin, MD      PDMP not reviewed this encounter.   Jannifer Franklin, MD 10/10/21 [phone]

## 2022-05-17 DIAGNOSIS — F1010 Alcohol abuse, uncomplicated: Secondary | ICD-10-CM | POA: Diagnosis not present

## 2022-05-23 DIAGNOSIS — F1010 Alcohol abuse, uncomplicated: Secondary | ICD-10-CM | POA: Diagnosis not present

## 2022-05-24 DIAGNOSIS — F1010 Alcohol abuse, uncomplicated: Secondary | ICD-10-CM | POA: Diagnosis not present

## 2022-05-29 DIAGNOSIS — F1010 Alcohol abuse, uncomplicated: Secondary | ICD-10-CM | POA: Diagnosis not present

## 2022-05-31 DIAGNOSIS — F1010 Alcohol abuse, uncomplicated: Secondary | ICD-10-CM | POA: Diagnosis not present

## 2022-06-05 DIAGNOSIS — F1010 Alcohol abuse, uncomplicated: Secondary | ICD-10-CM | POA: Diagnosis not present

## 2022-06-07 DIAGNOSIS — F1010 Alcohol abuse, uncomplicated: Secondary | ICD-10-CM | POA: Diagnosis not present

## 2022-06-12 DIAGNOSIS — F1010 Alcohol abuse, uncomplicated: Secondary | ICD-10-CM | POA: Diagnosis not present

## 2022-06-14 DIAGNOSIS — F1010 Alcohol abuse, uncomplicated: Secondary | ICD-10-CM | POA: Diagnosis not present

## 2022-06-19 DIAGNOSIS — F1010 Alcohol abuse, uncomplicated: Secondary | ICD-10-CM | POA: Diagnosis not present

## 2022-06-21 DIAGNOSIS — F1010 Alcohol abuse, uncomplicated: Secondary | ICD-10-CM | POA: Diagnosis not present

## 2023-10-31 ENCOUNTER — Encounter (HOSPITAL_COMMUNITY): Payer: Self-pay

## 2023-10-31 ENCOUNTER — Ambulatory Visit (INDEPENDENT_AMBULATORY_CARE_PROVIDER_SITE_OTHER)

## 2023-10-31 ENCOUNTER — Ambulatory Visit (HOSPITAL_COMMUNITY)
Admission: RE | Admit: 2023-10-31 | Discharge: 2023-10-31 | Disposition: A | Discharge status: Home / Self Care | Payer: Self-pay | Source: Ambulatory Visit | Attending: Internal Medicine | Admitting: Internal Medicine

## 2023-10-31 VITALS — BP 110/75 | HR 81 | Temp 97.5°F | Resp 18

## 2023-10-31 DIAGNOSIS — R0602 Shortness of breath: Secondary | ICD-10-CM | POA: Diagnosis not present

## 2023-10-31 DIAGNOSIS — R062 Wheezing: Secondary | ICD-10-CM | POA: Diagnosis not present

## 2023-10-31 DIAGNOSIS — R0609 Other forms of dyspnea: Secondary | ICD-10-CM

## 2023-10-31 DIAGNOSIS — J301 Allergic rhinitis due to pollen: Secondary | ICD-10-CM

## 2023-10-31 DIAGNOSIS — R059 Cough, unspecified: Secondary | ICD-10-CM | POA: Diagnosis not present

## 2023-10-31 HISTORY — DX: Other specified health status: Z78.9

## 2023-10-31 MED ORDER — CLARITIN-D 24 HOUR 10-240 MG PO TB24: 1.0000 | ORAL_TABLET | Freq: Every day | ORAL | 0 refills | Status: AC

## 2023-10-31 MED ORDER — ALBUTEROL SULFATE HFA 108 (90 BASE) MCG/ACT IN AERS: 2.0000 | INHALATION_SPRAY | RESPIRATORY_TRACT | 0 refills | Status: DC | PRN

## 2023-10-31 NOTE — Discharge Instructions (Addendum)
 Your lung exam is normal and I can't give you a letter to get you off jury duty for your chronic condition since I am not your primary care doctor and this is urgent care which we do not do that here.  Go see a primary care doctor to see if you need to be sent to a lung specialist to do more testing for your lungs.    I will call you if your chest xray looks abnormal.   Call [facility] [address] [place],  [place]  [address]  Main: [phone]

## 2023-10-31 NOTE — ED Triage Notes (Signed)
 Pt states he has jury duty coming up that he would like to get out of due to his lungs. He states he has been having shortness of breath for between 2-5 years. He states he has had intermittent coughing around 4-5 years as well as well. He states he used an inhaler in the past which has been helpful.

## 2023-10-31 NOTE — ED Provider Notes (Signed)
 [facility]    CSN: [id number] Arrival date & time: 10/31/23  9047      History   Chief Complaint Chief Complaint  Patient presents with   Cough    Feel short on breath /check up . - Entered by patient   Shortness of Breath    HPI Philip Wong is a 33 y.o. male who presents with onset of nose congestion for 7-10 days, denies sneezing. He has also been SOB and having intermittent coughs for the past 2 years. Has not had a fever or felt ill with current symptoms.  He had covid  08/23 and  since then he keeps a cough and has intermittent wheezing.  Gets SOB when wearing a mask, or when very active. He has never had this as kid, and is new to him. Does not have a PCP. He used to smoke and stopped about 10 years ago.  He would like a note to get him off jury duty due to his lung conditions, but he does not have a diagnosis. Has not see a pulmonologist.  His sister has asthma.      Past Medical History:  Diagnosis Date   No pertinent past medical history     There are no active problems to display for this patient.   Past Surgical History:  Procedure Laterality Date   NO PAST SURGERIES         Home Medications    Prior to Admission medications   Medication Sig Start Date End Date Taking? Authorizing Provider  albuterol  (VENTOLIN  HFA) 108 (90 Base) MCG/ACT inhaler Inhale 2 puffs into the lungs every 4 (four) hours as needed for wheezing or shortness of breath. 10/31/23  Yes Rodriguez-Southworth, Raveen Wieseler, PA-C  loratadine -pseudoephedrine (CLARITIN -D 24 HOUR) 10-240 MG 24 hr tablet Take 1 tablet by mouth daily. 10/31/23  Yes Rodriguez-Southworth, Kyra, PA-C    Family History Family History  Problem Relation Age of Onset   Healthy Mother    Healthy Father     Social History Social History   Tobacco Use   Smoking status: Former    Types: Cigarettes    Passive exposure: Past  Vaping Use   Vaping status: Never Used  Substance Use Topics   Alcohol use:  Yes    Comment: occ   Drug use: No     Allergies   Patient has no known allergies.   Review of Systems Review of Systems As noted in HPI  Physical Exam Triage Vital Signs ED Triage Vitals [10/31/23 1030]  Encounter Vitals Group     BP 110/75     Girls Systolic BP Percentile      Girls Diastolic BP Percentile      Boys Systolic BP Percentile      Boys Diastolic BP Percentile      Pulse Rate 81     Resp 18     Temp (!) 97.5 F (36.4 C)     Temp Source Oral     SpO2 97 %     Weight      Height      Head Circumference      Peak Flow      Pain Score 0     Pain Loc      Pain Education      Exclude from Growth Chart    No data found.  Updated Vital Signs BP 110/75 (BP Location: Right Arm)   Pulse 81   Temp (!) 97.5 F (36.4 C) (  Oral)   Resp 18   SpO2 97%   Visual Acuity Right Eye Distance:   Left Eye Distance:   Bilateral Distance:    Right Eye Near:   Left Eye Near:    Bilateral Near:     Physical Exam Physical Exam Constitutional:      General: He is not in acute distress.    Appearance: He is not toxic-appearing.  HENT:     Head: Normocephalic.     Right Ear: Tympanic membrane, ear canal and external ear normal.     Left Ear: Ear canal and external ear normal.     Nose: pink pale swollen mucosa with clear mucous     Mouth/Throat:     Mouth: Mucous membranes are moist.     Pharynx: Oropharynx is clear.  Eyes:     General: No scleral icterus.    Conjunctiva/sclera: Conjunctivae normal.  Cardiovascular:     Rate and Rhythm: Normal rate and regular rhythm.     Heart sounds: No murmur heard.   Pulmonary:     Effort: Pulmonary effort is normal. No respiratory distress.     Breath sounds: Wheezing present.     Comments: Has auditory wheezing Musculoskeletal:        General: Normal range of motion.     Cervical back: Neck supple.  Lymphadenopathy:     Cervical: No cervical adenopathy.  Skin:    General: Skin is warm and dry.     Findings: No  rash.  Neurological:     Mental Status: He is alert and oriented to person, place, and time.     Gait: Gait normal.  Psychiatric:        Mood and Affect: Mood normal.        Behavior: Behavior normal.        Thought Content: Thought content normal.        Judgment: Judgment normal.    UC Treatments / Results  Labs (all labs ordered are listed, but only abnormal results are displayed) Labs Reviewed - No data to display  EKG   Radiology DG Chest 2 View Result Date: 10/31/2023 EXAM: 2 VIEW(S) XRAY OF THE CHEST 10/31/2023 11:35:11 AM COMPARISON: 10/10/2021 CLINICAL HISTORY: SOB, wheezing x 2 years. Past hx of being a smoker. Pt states he has jury duty coming up that he would like to get out of due to his lungs. He states he has been having shortness of breath for between 2-5 years. He states he has had intermittent coughing around 4-5 years as well. He states he used an inhaler in the past which has been helpful. FINDINGS: LUNGS AND PLEURA: No focal pulmonary opacity. No pulmonary edema. No pleural effusion. No pneumothorax. HEART AND MEDIASTINUM: No acute abnormality of the cardiac and mediastinal silhouettes. BONES AND SOFT TISSUES: No acute osseous abnormality. IMPRESSION: 1. No acute process. Electronically signed by: Donnice Mania MD 10/31/2023 11:39 AM EDT RP Workstation: [id number]    Procedures Procedures (including critical care time)  Medications Ordered in UC Medications - No data to display  Initial Impression / Assessment and Plan / UC Course  I have reviewed the triage vital signs and the nursing notes.  Pertinent  imaging results that were available during my care of the patient were reviewed by me and considered in my medical decision making (see chart for details).  Allergic rhinitis Wheezing, possible RAD from allergies  He needs to get establish with a PCP and gave him information to call [facility]. I placed him on Claritin  D and Albuterol  inhaler  as noted.  I will call him if his chest xray is abnormal.    Final Clinical Impressions(s) / UC Diagnoses   Final diagnoses:  Non-seasonal allergic rhinitis due to pollen  Wheezing  Other form of dyspnea     Discharge Instructions      Your lung exam is normal and I can't give you a letter to get you off jury duty for your chronic condition since I am not your primary care doctor and this is urgent care which we do not do that here.  Go see a primary care doctor to see if you need to be sent to a lung specialist to do more testing for your lungs.    I will call you if your chest xray looks abnormal.   Call [facility] [address] [place],  [place]  [address]  Main: [phone]    ED Prescriptions     Medication Sig Dispense Auth. Provider   albuterol  (VENTOLIN  HFA) 108 (90 Base) MCG/ACT inhaler Inhale 2 puffs into the lungs every 4 (four) hours as needed for wheezing or shortness of breath. 18 g Rodriguez-Southworth, Kyra, PA-C   loratadine -pseudoephedrine (CLARITIN -D 24 HOUR) 10-240 MG 24 hr tablet Take 1 tablet by mouth daily. 30 tablet Rodriguez-Southworth, Kyra, PA-C      PDMP not reviewed this encounter.   Lindi Kyra, PA-C 10/31/23 1150

## 2023-11-07 ENCOUNTER — Ambulatory Visit: Payer: Self-pay | Admitting: Nurse Practitioner

## 2023-11-07 VITALS — BP 120/80 | HR 76 | Temp 98.8°F | Ht 61.0 in | Wt 140.8 lb

## 2023-11-07 DIAGNOSIS — R0602 Shortness of breath: Secondary | ICD-10-CM | POA: Diagnosis not present

## 2023-11-07 DIAGNOSIS — Z13220 Encounter for screening for lipoid disorders: Secondary | ICD-10-CM

## 2023-11-07 DIAGNOSIS — Z13228 Encounter for screening for other metabolic disorders: Secondary | ICD-10-CM | POA: Diagnosis not present

## 2023-11-07 DIAGNOSIS — R059 Cough, unspecified: Secondary | ICD-10-CM | POA: Insufficient documentation

## 2023-11-07 DIAGNOSIS — Z2821 Immunization not carried out because of patient refusal: Secondary | ICD-10-CM

## 2023-11-07 DIAGNOSIS — Z833 Family history of diabetes mellitus: Secondary | ICD-10-CM | POA: Diagnosis not present

## 2023-11-07 DIAGNOSIS — R058 Other specified cough: Secondary | ICD-10-CM

## 2023-11-07 DIAGNOSIS — Z7689 Persons encountering health services in other specified circumstances: Secondary | ICD-10-CM

## 2023-11-07 DIAGNOSIS — Z136 Encounter for screening for cardiovascular disorders: Secondary | ICD-10-CM | POA: Diagnosis not present

## 2023-11-07 MED ORDER — TRIAMCINOLONE ACETONIDE 40 MG/ML IJ SUSP
60.0000 mg | Freq: Once | INTRAMUSCULAR | Status: AC
  Administered 2023-11-07: 60 mg via INTRAMUSCULAR

## 2023-11-07 MED ORDER — PREDNISONE 20 MG PO TABS
ORAL_TABLET | ORAL | 0 refills | Status: AC
Start: 2023-11-07 — End: ?

## 2023-11-07 MED ORDER — AIRSUPRA 90-80 MCG/ACT IN AERO: 2.0000 | INHALATION_SPRAY | Freq: Four times a day (QID) | RESPIRATORY_TRACT | 1 refills | Status: AC | PRN

## 2023-11-07 NOTE — Progress Notes (Signed)
 LILLETTE Kristeen JINNY Gladis, CMA,acting as a Neurosurgeon for Gaines Ada, FNP.,have documented all relevant documentation on the behalf of Gaines Ada, FNP,as directed by  Gaines Ada, FNP while in the presence of Gaines Ada, FNP.  Subjective:  Patient ID: Philip Wong , male    DOB: [birth year] , 33 y.o.   MRN: [id number]  Chief Complaint  Patient presents with   Establish Care    Patient presents today to establish care, Patient reports compliance with medication. Patient denies any chest pain, or headaches. Patient reports he went to UC on 9/11 and was told he was wheezing he was treated with inhaler and allergy medication. Patient reports he has on and off SOB.     HPI Discussed the use of AI scribe software for clinical note transcription with the patient, who gave verbal consent to proceed.  History of Present Illness Philip Wong is a 33 year old male who presents to establish care who is complaining of having shortness of breath and chest pain.  He experiences shortness of breath and chest pain, particularly during physical exertion such as running, even over short distances. He also experiences shortness of breath in confined spaces. These symptoms have been present for a while and may be linked to his past smoking habits, which he quit in 2018. The chest pain occurs during physical activity, and he sometimes breathes heavily in tight spaces.  He visited urgent care where wheezing was detected in his lungs. He was provided with an albuterol  inhaler but was not treated with steroids. He has a history of a similar episode a few years ago when he was diagnosed with COVID-19, during which he was also given an inhaler. He used the inhaler for about a month before discontinuing it.  He experiences coughing approximately two to three times a week. He is concerned about his symptoms affecting his ability to attend jury duty scheduled for October 8th.  He has a family history of diabetes in his father and asthma in  his sister. He is single, has no children, and previously smoked on and off but has quit since 2018. He is also trying to cut back on alcohol consumption. 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 He is originally from [place] and moved to the [place]  about [place] years ago.  Past Medical History:  Diagnosis Date   No pertinent past medical history      Family History  Problem Relation Age of Onset   Healthy Mother    Diabetes Father    Asthma Sister      Current Outpatient Medications:    Albuterol -Budesonide (AIRSUPRA ) 90-80 MCG/ACT AERO, Inhale 2 puffs into the lungs every 6 (six) hours as needed., Disp: 32.1 g, Rfl: 1   loratadine -pseudoephedrine (CLARITIN -D 24 HOUR) 10-240 MG 24 hr tablet, Take 1 tablet by mouth daily., Disp: 30 tablet, Rfl: 0   predniSONE  (DELTASONE ) 20 MG tablet, Take 2 tabs by mouth daily x 3 days start 11/08/2023, Disp: 6 tablet, Rfl: 0   No Known Allergies   Review of Systems  Constitutional: Negative.   Respiratory:  Positive for cough and chest tightness.   Cardiovascular: Negative.   Neurological: Negative.   Psychiatric/Behavioral: Negative.       Today's Vitals   11/07/23 1017  BP: 120/80  Pulse: 76  Temp: 98.8 F (37.1 C)  TempSrc: Oral  SpO2: 97%  Weight: 140 lb 12.8 oz (63.9 kg)  Height: 5' 1 (1.549 m)  PainSc: 0-No pain  Body mass index is 26.6 kg/m.  Wt Readings from Last 3 Encounters:  11/07/23 140 lb 12.8 oz (63.9 kg)  05/02/17 142 lb (64.4 kg)  11/16/11 142 lb (64.4 kg)    Objective:  Physical Exam Vitals and nursing note reviewed.  Constitutional:      General: He is not in acute distress.    Appearance: Normal appearance.  HENT:     Head: Normocephalic.  Cardiovascular:     Rate and Rhythm: Normal rate and regular rhythm.     Pulses: Normal pulses.     Heart sounds: Normal heart sounds. No murmur heard. Pulmonary:     Effort: Pulmonary effort is normal. No respiratory distress.     Breath sounds: Normal breath  sounds. No wheezing.  Musculoskeletal:        General: Normal range of motion.  Skin:    General: Skin is warm and dry.     Capillary Refill: Capillary refill takes less than 2 seconds.  Neurological:     General: No focal deficit present.     Mental Status: He is alert and oriented to person, place, and time.  Psychiatric:        Mood and Affect: Mood normal.        Behavior: Behavior normal.        Thought Content: Thought content normal.        Judgment: Judgment normal.      Assessment And Plan:  Establishing care with new doctor, encounter for -     CMP14+EGFR -     CBC with Differential/Platelet  Influenza vaccination declined  COVID-19 vaccination declined  SOB (shortness of breath) on exertion Assessment & Plan: Intermittent shortness of breath and wheezing exacerbated by exertion and tight spaces. Ceased smoking in 2018. Previous COVID-19 infection with residual respiratory issues. Possible lung inflammation. Differential includes asthma, given family history. - Administered steroid injection. - Prescribed oral steroids starting next day. - Provided coupon for steroid inhaler (Airsupra ) - Advised symptom monitoring and report by Monday if no improvement. - Consider pulmonologist referral if symptoms persist or worsen. - Ordered baseline blood work for cholesterol and diabetes risk. - at this time I am not writing a letter for [employer] duty. He has not been a patient for 90 days according to our current policy.   Orders: -     Triamcinolone  Acetonide -     Airsupra ; Inhale 2 puffs into the lungs every 6 (six) hours as needed.  Dispense: 32.1 g; Refill: 1 -     predniSONE ; Take 2 tabs by mouth daily x 3 days start 11/08/2023  Dispense: 6 tablet; Refill: 0  Other cough -     Triamcinolone  Acetonide -     Airsupra ; Inhale 2 puffs into the lungs every 6 (six) hours as needed.  Dispense: 32.1 g; Refill: 1 -     CBC with Differential/Platelet  Encounter for screening for  metabolic disorder  Family history of diabetes mellitus -     Hemoglobin A1c  Encounter for lipid screening for cardiovascular disease -     Lipid panel     Return in about 3 months (around 02/06/2024) for Phy.  Patient was given opportunity to ask questions. Patient verbalized understanding of the plan and was able to repeat key elements of the plan. All questions were answered to their satisfaction.    LILLETTE Gaines Ada, FNP, have reviewed all documentation for this visit. The documentation on 11/07/23 for the exam, diagnosis, procedures, and  orders are all accurate and complete.   IF YOU HAVE BEEN REFERRED TO A SPECIALIST, IT MAY TAKE 1-2 WEEKS TO SCHEDULE/PROCESS THE REFERRAL. IF YOU HAVE NOT HEARD FROM US /SPECIALIST IN TWO WEEKS, PLEASE GIVE US  A CALL AT [phone].

## 2023-11-07 NOTE — Assessment & Plan Note (Signed)
 Intermittent shortness of breath and wheezing exacerbated by exertion and tight spaces. Ceased smoking in 2018. Previous COVID-19 infection with residual respiratory issues. Possible lung inflammation. Differential includes asthma, given family history. - Administered steroid injection. - Prescribed oral steroids starting next day. - Provided coupon for steroid inhaler (Airsupra ) - Advised symptom monitoring and report by Monday if no improvement. - Consider pulmonologist referral if symptoms persist or worsen. - Ordered baseline blood work for cholesterol and diabetes risk. - at this time I am not writing a letter for [employer] duty. He has not been a patient for 90 days according to our current policy.

## 2023-11-08 LAB — HEMOGLOBIN A1C
Est. average glucose Bld gHb Est-mCnc: 105 mg/dL
Hgb A1c MFr Bld: 5.3 % (ref 4.8–5.6)

## 2023-11-08 LAB — CBC WITH DIFFERENTIAL/PLATELET
Basophils Absolute: 0 x10E3/uL (ref 0.0–0.2)
Basos: 0 %
EOS (ABSOLUTE): 0.2 x10E3/uL (ref 0.0–0.4)
Eos: 3 %
Hematocrit: 42.3 % (ref 37.5–51.0)
Hemoglobin: 13.3 g/dL (ref 13.0–17.7)
Immature Grans (Abs): 0 x10E3/uL (ref 0.0–0.1)
Immature Granulocytes: 0 %
Lymphocytes Absolute: 2.4 x10E3/uL (ref 0.7–3.1)
Lymphs: 37 %
MCH: 25.2 pg — ABNORMAL LOW (ref 26.6–33.0)
MCHC: 31.4 g/dL — ABNORMAL LOW (ref 31.5–35.7)
MCV: 80 fL (ref 79–97)
Monocytes Absolute: 0.4 x10E3/uL (ref 0.1–0.9)
Monocytes: 7 %
Neutrophils Absolute: 3.5 x10E3/uL (ref 1.4–7.0)
Neutrophils: 53 %
Platelets: 322 x10E3/uL (ref 150–450)
RBC: 5.27 x10E6/uL (ref 4.14–5.80)
RDW: 13.4 % (ref 11.6–15.4)
WBC: 6.6 x10E3/uL (ref 3.4–10.8)

## 2023-11-08 LAB — CMP14+EGFR
ALT: 30 IU/L (ref 0–44)
AST: 18 IU/L (ref 0–40)
Albumin: 4.6 g/dL (ref 4.1–5.1)
Alkaline Phosphatase: 67 IU/L (ref 47–123)
BUN/Creatinine Ratio: 16 (ref 9–20)
BUN: 15 mg/dL (ref 6–20)
Bilirubin Total: 0.5 mg/dL (ref 0.0–1.2)
CO2: 19 mmol/L — ABNORMAL LOW (ref 20–29)
Calcium: 9.4 mg/dL (ref 8.7–10.2)
Chloride: 102 mmol/L (ref 96–106)
Creatinine, Ser: 0.93 mg/dL (ref 0.76–1.27)
Globulin, Total: 2.8 g/dL (ref 1.5–4.5)
Glucose: 97 mg/dL (ref 70–99)
Potassium: 4.3 mmol/L (ref 3.5–5.2)
Sodium: 138 mmol/L (ref 134–144)
Total Protein: 7.4 g/dL (ref 6.0–8.5)
eGFR: 110 mL/min/1.73 (ref >59)

## 2023-11-08 LAB — LIPID PANEL
Chol/HDL Ratio: 3.9 ratio (ref 0.0–5.0)
Cholesterol, Total: 202 mg/dL — ABNORMAL HIGH (ref 100–199)
HDL: 52 mg/dL (ref >39)
LDL Chol Calc (NIH): 135 mg/dL — ABNORMAL HIGH (ref 0–99)
Triglycerides: 81 mg/dL (ref 0–149)
VLDL Cholesterol Cal: 15 mg/dL (ref 5–40)

## 2023-11-17 ENCOUNTER — Ambulatory Visit: Payer: Self-pay | Admitting: Nurse Practitioner
